# Patient Record
Sex: Female | Born: 1995 | Race: Black or African American | Hispanic: No | Marital: Single | Smoking: Never smoker
Health system: Southern US, Community
[De-identification: ages and names within clinical notes are randomized; demographics above are authoritative.]

---
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
## 2017-04-04 ENCOUNTER — Encounter (HOSPITAL_COMMUNITY): Payer: Self-pay | Admitting: *Deleted

## 2017-04-04 ENCOUNTER — Other Ambulatory Visit: Payer: Self-pay

## 2017-04-04 ENCOUNTER — Emergency Department (HOSPITAL_COMMUNITY)
Admission: EM | Admit: 2017-04-04 | Discharge: 2017-04-04 | Disposition: A | Discharge status: Home / Self Care | Payer: Self-pay | Attending: Emergency Medicine | Admitting: Emergency Medicine

## 2017-04-04 ENCOUNTER — Emergency Department (HOSPITAL_COMMUNITY): Payer: Self-pay

## 2017-04-04 DIAGNOSIS — J069 Acute upper respiratory infection, unspecified: Secondary | ICD-10-CM | POA: Insufficient documentation

## 2017-04-04 DIAGNOSIS — B9789 Other viral agents as the cause of diseases classified elsewhere: Secondary | ICD-10-CM

## 2017-04-04 NOTE — ED Triage Notes (Addendum)
Pt c/o cough that is non productive for the past few days, denies any fever, does admit to chest pain that is worse with coughing, headache and chills as well,

## 2017-04-04 NOTE — ED Notes (Signed)
Pt to xray

## 2017-04-04 NOTE — ED Provider Notes (Signed)
[facility] Provider Note   CSN: [id number] Arrival date & time: 04/04/17  1956     History   Chief Complaint Chief Complaint  Patient presents with  . Cough    HPI Kelly Graham is a 21 y.o. female.  The history is provided by the patient. No language interpreter was used.  Cough  This is a new problem. The current episode started more than 2 days ago. The problem occurs constantly. The cough is non-productive. There has been no fever. Associated symptoms include rhinorrhea and sore throat. Pertinent negatives include no chest pain. She has tried nothing for the symptoms. The treatment provided moderate relief. Her past medical history does not include pneumonia.    History reviewed. No pertinent past medical history.  There are no active problems to display for this patient.   History reviewed. No pertinent surgical history.  OB History    No data available       Home Medications    Prior to Admission medications   Not on File    Family History No family history on file.  Social History Social History   Tobacco Use  . Smoking status: Never Smoker  . Smokeless tobacco: Never Used  Substance Use Topics  . Alcohol use: No    Frequency: Never  . Drug use: No     Allergies   Patient has no known allergies.   Review of Systems Review of Systems  HENT: Positive for rhinorrhea and sore throat.   Respiratory: Positive for cough.   Cardiovascular: Negative for chest pain.  All other systems reviewed and are negative.    Physical Exam Updated Vital Signs BP 123/76 (BP Location: Right Arm)   Pulse 78   Temp 99 F (37.2 C) (Oral)   Resp 16   Ht 5' (1.524 m)   Wt 63.5 kg (140 lb)   LMP 03/18/2017   SpO2 100%   BMI 27.34 kg/m   Physical Exam  Constitutional: She appears well-developed and well-nourished. No distress.  HENT:  Head: Normocephalic and atraumatic.  Eyes: Conjunctivae are normal.  Neck: Neck supple.    Cardiovascular: Normal rate and regular rhythm.  No murmur heard. Pulmonary/Chest: Effort normal and breath sounds normal. No respiratory distress.  Abdominal: Soft. There is no tenderness.  Musculoskeletal: She exhibits no edema.  Neurological: She is alert.  Skin: Skin is warm and dry.  Psychiatric: She has a normal mood and affect.  Nursing note and vitals reviewed.    ED Treatments / Results  Labs (all labs ordered are listed, but only abnormal results are displayed) Labs Reviewed - No data to display  EKG  EKG Interpretation None       Radiology Dg Chest 2 View  Result Date: 04/04/2017 CLINICAL DATA:  Dry cough, SOB, and wheezing for a few days. Patient states that she has been feeling sick but that it has gotten worse today. Patient states that she is having chest pain that feels like her chest is "caving in". EXAM: CHEST  2 VIEW COMPARISON:  None. FINDINGS: Normal mediastinum and cardiac silhouette. Normal pulmonary vasculature. No evidence of effusion, infiltrate, or pneumothorax. No acute bony abnormality. IMPRESSION: Normal chest radiograph. Electronically Signed   By: Genevive BiStewart  Edmunds M.D.   On: 04/04/2017 20:30    Procedures Procedures (including critical care time)  Medications Ordered in ED Medications - No data to display   Initial Impression / Assessment and Plan / ED Course  I have reviewed  the triage vital signs and the nursing notes.  Pertinent labs & imaging results that were available during my care of the patient were reviewed by me and considered in my medical decision making (see chart for details).     Pt counseled on viral illness.  Pt advised tylenol, rest,  Follow up with priomary Md if symptoms persist  Final Clinical Impressions(s) / ED Diagnoses   Final diagnoses:  Viral URI with cough    ED Discharge Orders    None    An After Visit Summary was printed and given to the patient.    Osie CheeksSofia, Coree Brame K, PA-C 04/04/17 2254     Jacalyn LefevreHaviland, Julie, MD 04/04/17 [phone]

## 2017-04-04 NOTE — Discharge Instructions (Signed)
Tylenol for fever.  Drink plenty of fluids 

## 2019-05-17 IMAGING — DX DG CHEST 2V
2 series · 2 of 2 positions shown · non-contrast
Comparison: None.

CLINICAL DATA: Dry cough, SOB, and wheezing for a few days. Patient
states that she has been feeling sick but that it has gotten worse
today. Patient states that she is having chest pain that feels like
her chest is "caving in".

EXAM:
CHEST  2 VIEW

[chest pa]
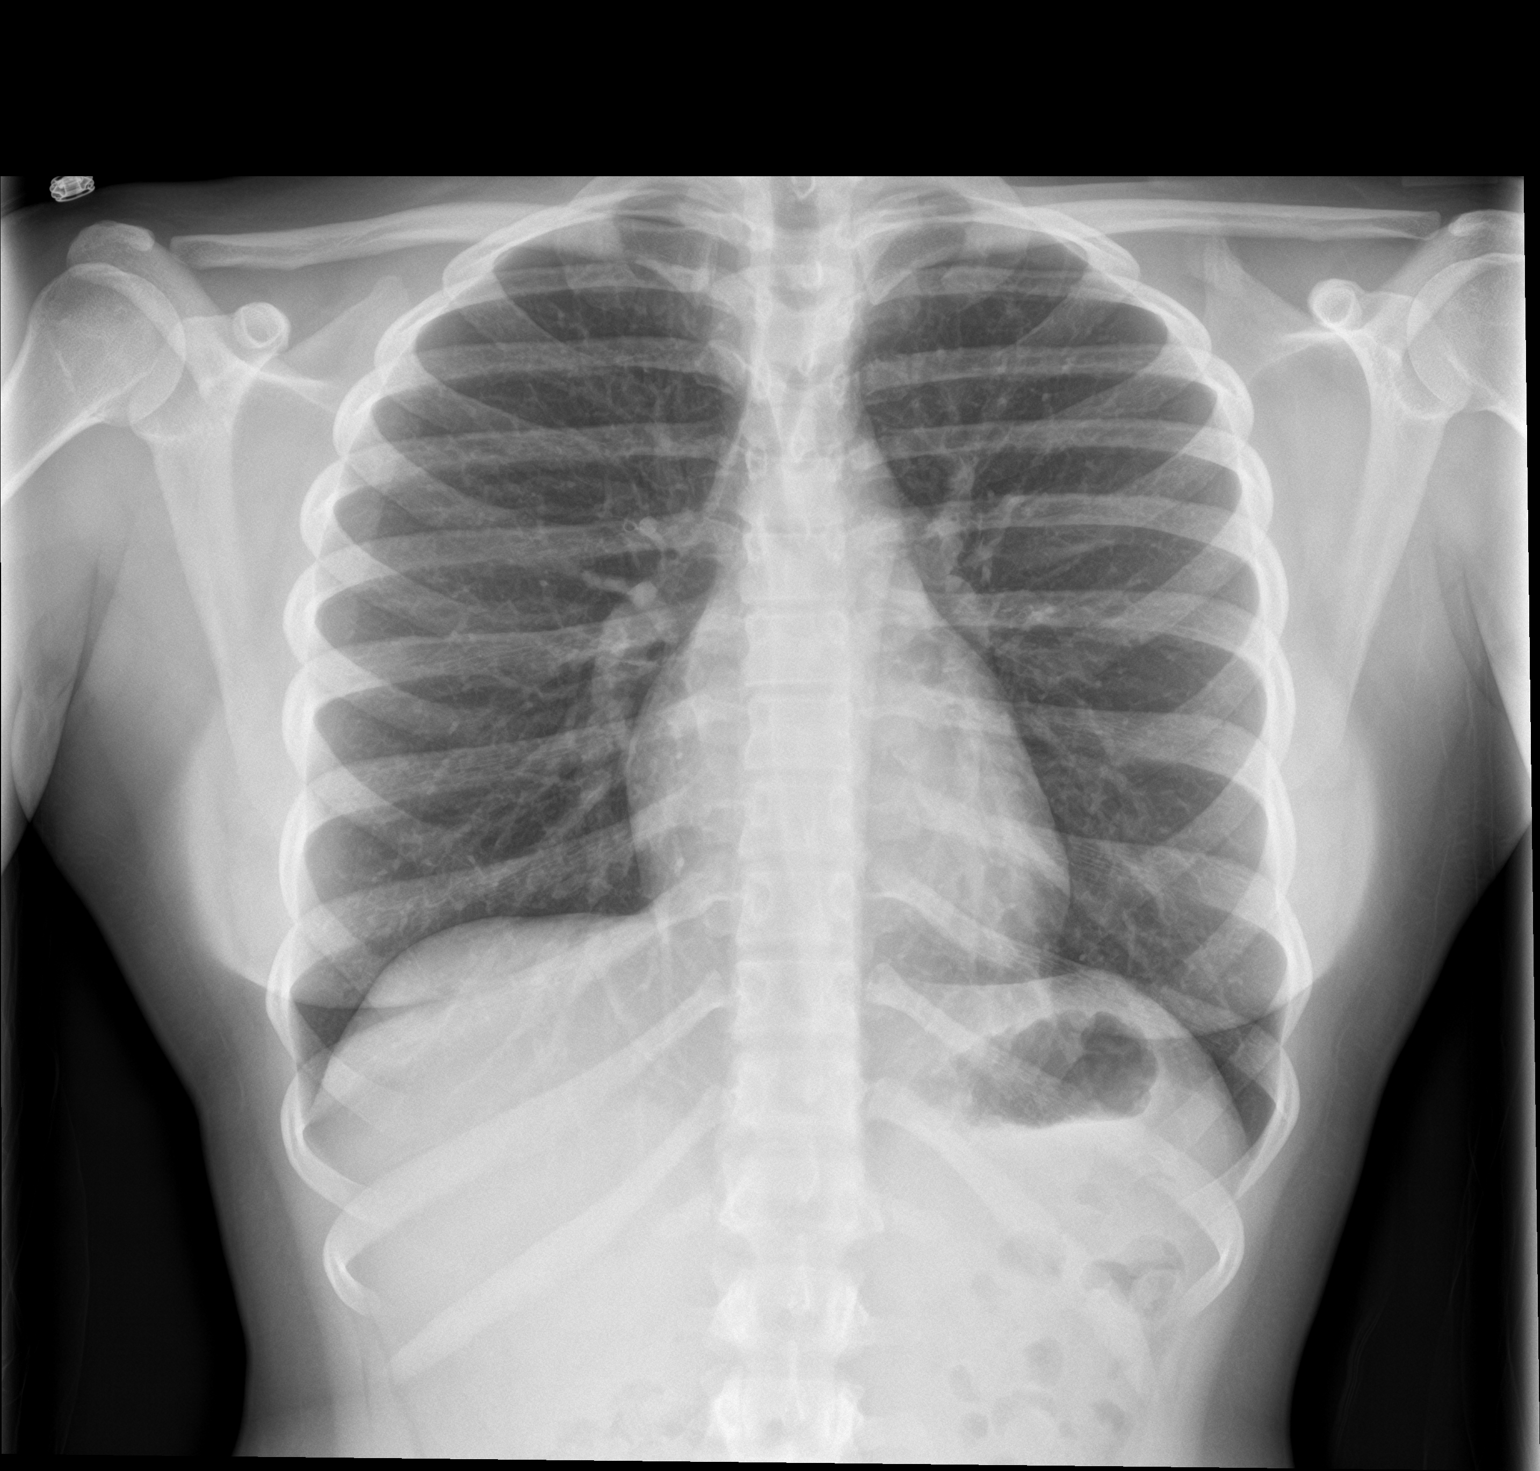

[chest lat]
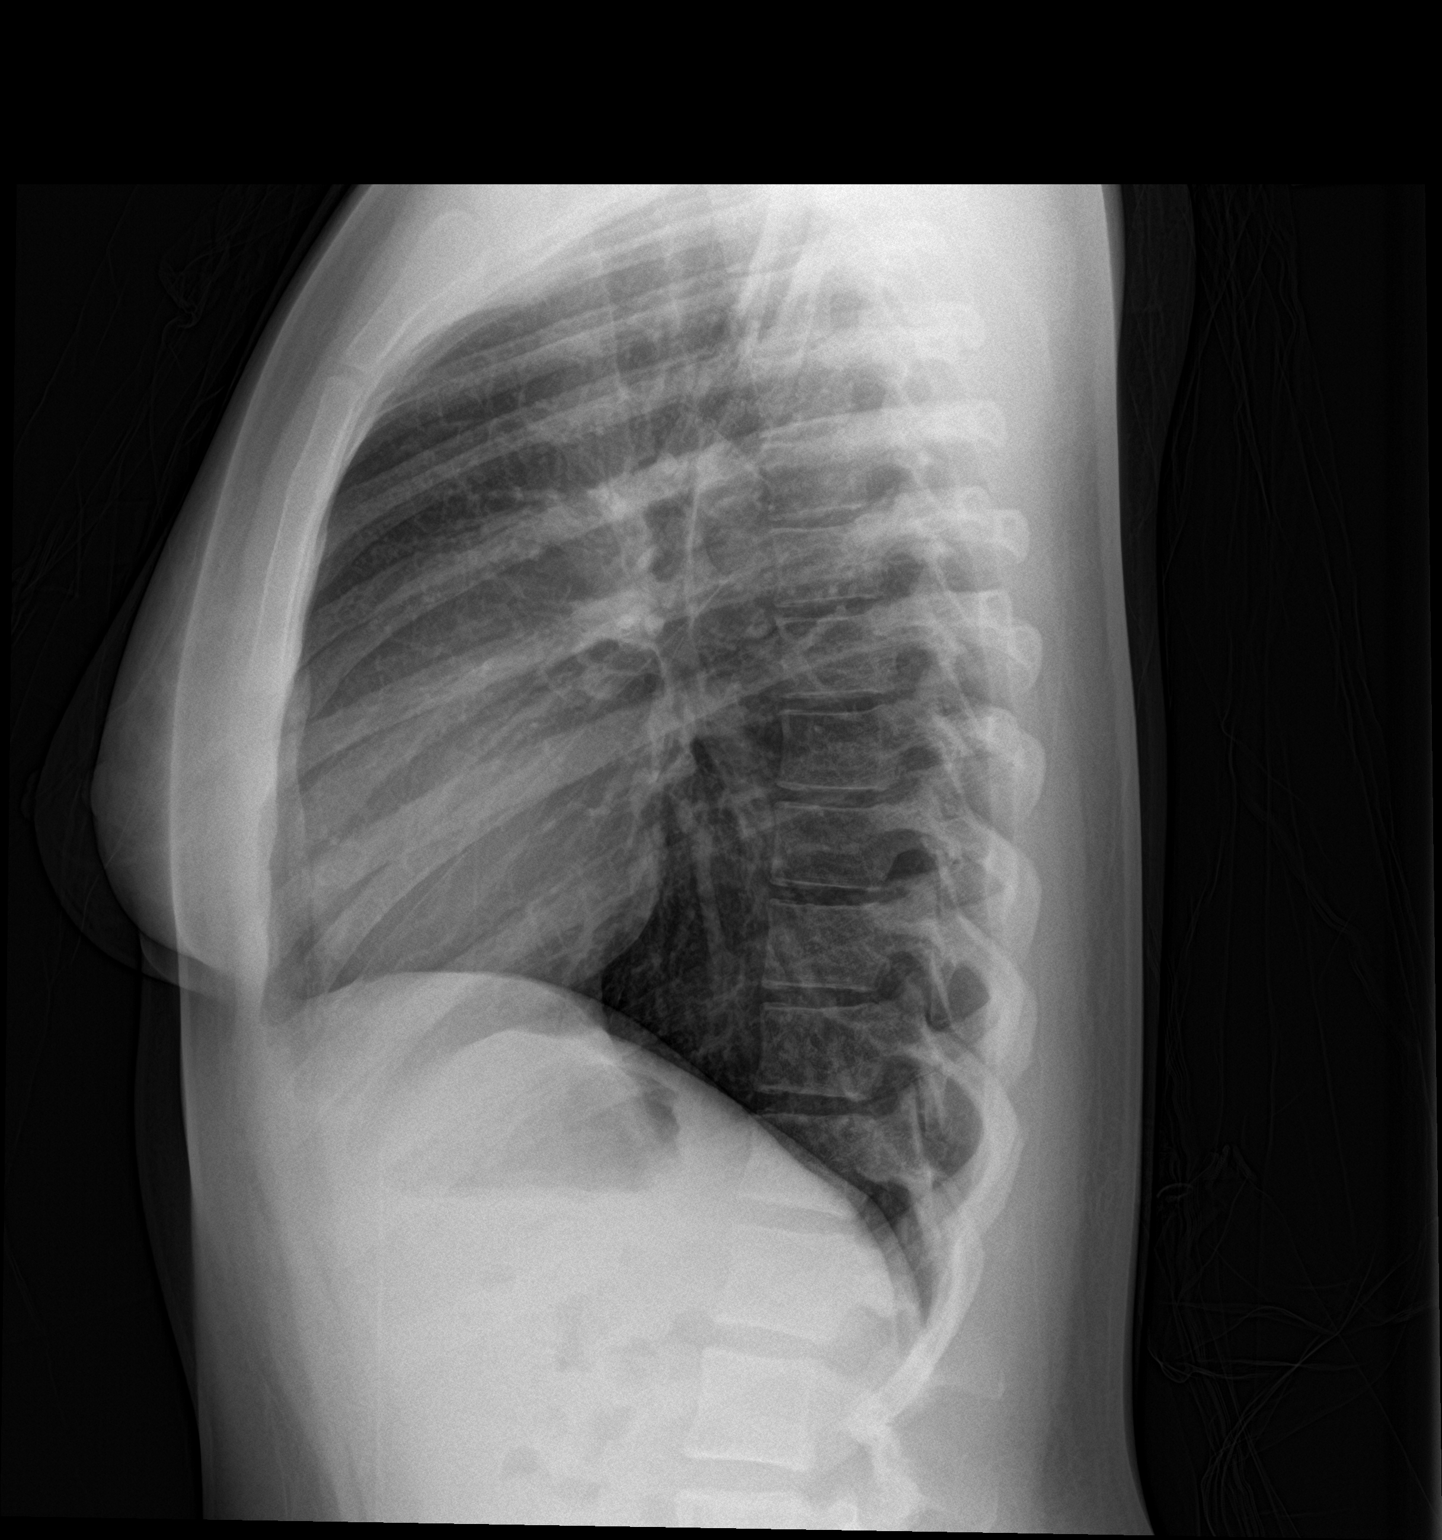

[2 of 2 positions shown; findings below may reference images not displayed]

FINDINGS: Normal mediastinum and cardiac silhouette. Normal pulmonary
vasculature. No evidence of effusion, infiltrate, or pneumothorax.
No acute bony abnormality.
IMPRESSION: Normal chest radiograph.
